# Patient Record
Sex: Female | Born: 1975 | Race: Black or African American | Hispanic: No | Marital: Married
Health system: Southern US, Community
[De-identification: ages and names within clinical notes are randomized; demographics above are authoritative.]

---

## 1999-10-30 ENCOUNTER — Emergency Department (HOSPITAL_COMMUNITY): Admission: EM | Admit: 1999-10-30 | Discharge: 1999-10-30 | Payer: Self-pay | Admitting: *Deleted

## 2000-07-03 ENCOUNTER — Other Ambulatory Visit: Admission: RE | Admit: 2000-07-03 | Discharge: 2000-07-03 | Payer: Self-pay | Admitting: Internal Medicine

## 2000-10-09 ENCOUNTER — Emergency Department (HOSPITAL_COMMUNITY): Admission: EM | Admit: 2000-10-09 | Discharge: 2000-10-09 | Payer: Self-pay | Admitting: Emergency Medicine

## 2001-10-07 ENCOUNTER — Emergency Department (HOSPITAL_COMMUNITY): Admission: EM | Admit: 2001-10-07 | Discharge: 2001-10-07 | Payer: Self-pay | Admitting: Emergency Medicine

## 2002-03-17 ENCOUNTER — Other Ambulatory Visit: Admission: RE | Admit: 2002-03-17 | Discharge: 2002-03-17 | Payer: Self-pay | Admitting: Internal Medicine

## 2003-05-12 ENCOUNTER — Other Ambulatory Visit: Admission: RE | Admit: 2003-05-12 | Discharge: 2003-05-12 | Payer: Self-pay | Admitting: Gynecology

## 2004-02-07 ENCOUNTER — Other Ambulatory Visit: Admission: RE | Admit: 2004-02-07 | Discharge: 2004-02-07 | Payer: Self-pay | Admitting: Gynecology

## 2004-08-21 ENCOUNTER — Encounter (INDEPENDENT_AMBULATORY_CARE_PROVIDER_SITE_OTHER): Payer: Self-pay | Admitting: Specialist

## 2004-08-21 ENCOUNTER — Inpatient Hospital Stay (HOSPITAL_COMMUNITY): Admission: AD | Admit: 2004-08-21 | Discharge: 2004-08-24 | Payer: Self-pay | Admitting: Gynecology

## 2004-10-02 ENCOUNTER — Other Ambulatory Visit: Admission: RE | Admit: 2004-10-02 | Discharge: 2004-10-02 | Payer: Self-pay | Admitting: Gynecology

## 2006-01-16 ENCOUNTER — Other Ambulatory Visit: Admission: RE | Admit: 2006-01-16 | Discharge: 2006-01-16 | Payer: Self-pay | Admitting: Obstetrics and Gynecology

## 2007-01-17 ENCOUNTER — Other Ambulatory Visit: Admission: RE | Admit: 2007-01-17 | Discharge: 2007-01-17 | Payer: Self-pay | Admitting: Obstetrics and Gynecology

## 2008-09-17 ENCOUNTER — Ambulatory Visit: Payer: Self-pay | Admitting: Internal Medicine

## 2008-09-17 DIAGNOSIS — A088 Other specified intestinal infections: Secondary | ICD-10-CM

## 2009-01-07 ENCOUNTER — Ambulatory Visit: Payer: Self-pay | Admitting: Internal Medicine

## 2009-01-07 DIAGNOSIS — J069 Acute upper respiratory infection, unspecified: Secondary | ICD-10-CM | POA: Insufficient documentation

## 2009-02-09 ENCOUNTER — Ambulatory Visit (HOSPITAL_COMMUNITY): Admission: RE | Admit: 2009-02-09 | Discharge: 2009-02-09 | Payer: Self-pay | Admitting: Orthopedic Surgery

## 2009-02-10 ENCOUNTER — Ambulatory Visit (HOSPITAL_COMMUNITY): Admission: RE | Admit: 2009-02-10 | Discharge: 2009-02-10 | Payer: Self-pay | Admitting: Orthopedic Surgery

## 2009-06-26 ENCOUNTER — Emergency Department (HOSPITAL_BASED_OUTPATIENT_CLINIC_OR_DEPARTMENT_OTHER): Admission: EM | Admit: 2009-06-26 | Discharge: 2009-06-26 | Payer: Self-pay | Admitting: Emergency Medicine

## 2009-06-27 ENCOUNTER — Ambulatory Visit: Payer: Self-pay | Admitting: Occupational Medicine

## 2009-07-11 ENCOUNTER — Emergency Department (HOSPITAL_BASED_OUTPATIENT_CLINIC_OR_DEPARTMENT_OTHER): Admission: EM | Admit: 2009-07-11 | Discharge: 2009-07-11 | Payer: Self-pay | Admitting: Emergency Medicine

## 2010-10-23 ENCOUNTER — Ambulatory Visit: Payer: Self-pay | Admitting: Internal Medicine

## 2010-11-26 ENCOUNTER — Encounter: Payer: Self-pay | Admitting: Orthopedic Surgery

## 2010-12-07 NOTE — Assessment & Plan Note (Signed)
Summary: cough/cold/dr plot pt/cd   Vital Signs:  Patient profile:   35 year old female Menstrual status:  regular LMP:     10/13/2010 Height:      64.5 inches Weight:      117.25 pounds BMI:     19.89 O2 Sat:      95 % on Room air Temp:     97.6 degrees F oral Pulse rate:   77 / minute Pulse rhythm:   regular Resp:     16 per minute BP sitting:   108 / 70  (left arm) Cuff size:   regular  Vitals Entered By: Rock Nephew CMA (October 23, 2010 2:33 PM)  O2 Flow:  Room air  Primary Care Marinell Igarashi:  Etta Grandchild MD  CC:  URI symptoms.  History of Present Illness:  URI Symptoms      This is a 35 year old woman who presents with URI symptoms.  The symptoms began 4 days ago.  The severity is described as mild.  The patient reports dry cough, productive cough, and sick contacts, but denies nasal congestion, clear nasal discharge, purulent nasal discharge, sore throat, and earache.  The patient denies fever, stiff neck, dyspnea, wheezing, rash, vomiting, diarrhea, use of an antipyretic, and response to antipyretic.  The patient also reports headache and muscle aches.  The patient denies itchy throat, sneezing, seasonal symptoms, and severe fatigue.  The patient denies the following risk factors for Strep sinusitis: unilateral facial pain, unilateral nasal discharge, poor response to decongestant, double sickening, tooth pain, Strep exposure, tender adenopathy, and absence of cough.    Preventive Screening-Counseling & Management  Alcohol-Tobacco     Alcohol drinks/day: 0     Alcohol Counseling: not indicated; patient does not drink     Smoking Status: never     Passive Smoke Exposure: no     Tobacco Counseling: not indicated; no tobacco use  Hep-HIV-STD-Contraception     Hepatitis Risk: no risk noted     HIV Risk: no     STD Risk: no risk noted      Sexual History:  currently monogamous.        Drug Use:  never.        Blood Transfusions:  no.    Medications Prior to  Update: 1)  Vitamin D3 1000 Unit  Tabs (Cholecalciferol) .Marland Kitchen.. 1 By Mouth Daily 2)  Hydrocodone-Acetaminophen 10-325 Mg Tabs (Hydrocodone-Acetaminophen) 3)  Lidocaine Viscous 2 % Soln (Lidocaine Hcl) .Marland Kitchen.. 10 Ml Swish and Spit Every 3 Hours For Pain  Current Medications (verified): 1)  Vitamin D3 1000 Unit  Tabs (Cholecalciferol) .Marland Kitchen.. 1 By Mouth Daily 2)  Omega 3 3)  Multivitamin 4)  Zithromax Tri-Pak 500 Mg Tab (Azithromycin) .... Take As Directed One By Mouth Once Daily For 3 Days 5)  Hydrocod Polst-Chlorphen Polst 10-8 Mg/78ml Lqcr (Hydrocod Polst-Chlorphen Polst) .... 5 Ml By Mouth Two Times A Day Pen For Cough  Allergies (verified): No Known Drug Allergies  Past History:  Past Medical History: Last updated: 09/17/2008 Unremarkable  Past Surgical History: Last updated: 09/17/2008 Denies surgical history  Family History: Last updated: 09/17/2008 Family History Diabetes 1st degree relative Family History Hypertension Family History of Stroke F 1st degree relative <60  Social History: Last updated: 09/17/2008 Occupation: Medical billing for [employer] Married Never Smoked Alcohol use-no Drug use-no Regular exercise-no  Risk Factors: Alcohol Use: 0 (10/23/2010) Exercise: no (09/17/2008)  Risk Factors: Smoking Status: never (10/23/2010) Passive Smoke Exposure: no (10/23/2010)  Family History: Reviewed history from 09/17/2008 and no changes required. Family History Diabetes 1st degree relative Family History Hypertension Family History of Stroke F 1st degree relative <60  Social History: Reviewed history from 09/17/2008 and no changes required. 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  The patient denies anorexia, fever, weight loss, weight gain, decreased hearing, chest pain, syncope, dyspnea on exertion, peripheral edema, hemoptysis, abdominal pain, suspicious skin lesions, and enlarged lymph nodes.    Physical Exam  General:  alert, well-developed, well-nourished, well-hydrated, appropriate dress, normal appearance, healthy-appearing, and cooperative to examination.   Head:  normocephalic, atraumatic, no abnormalities observed, and no abnormalities palpated.   Eyes:  vision grossly intact, pupils equal, and no injection.   Ears:  R ear normal and L ear normal.   Nose:  External nasal examination shows no deformity or inflammation. Nasal mucosa are pink and moist without lesions or exudates. Mouth:  Oral mucosa and oropharynx without lesions or exudates.  Teeth in good repair. Neck:  supple, full ROM, no masses, no thyromegaly, no thyroid nodules or tenderness, no JVD, normal carotid upstroke, no carotid bruits, no cervical lymphadenopathy, and no neck tenderness.   Lungs:  normal respiratory effort, no intercostal retractions, no accessory muscle use, normal breath sounds, no dullness, no fremitus, no crackles, and no wheezes.   Heart:  normal rate, regular rhythm, no murmur, no gallop, no rub, and no JVD.   Abdomen:  soft, non-tender, normal bowel sounds, no distention, no masses, no guarding, no rigidity, no rebound tenderness, no abdominal hernia, no inguinal hernia, no hepatomegaly, and no splenomegaly.   Msk:  No deformity or scoliosis noted of thoracic or lumbar spine.   Pulses:  R and L carotid,radial,femoral,dorsalis pedis and posterior tibial pulses are full and equal bilaterally Extremities:  No clubbing, cyanosis, edema, or deformity noted with normal full range of motion of all joints.   Neurologic:  No cranial nerve deficits noted. Station and gait are normal. Plantar reflexes are down-going bilaterally. DTRs are symmetrical throughout. Sensory, motor  and coordinative functions appear intact. Skin:  turgor normal, color normal, no rashes, no suspicious lesions, no ecchymoses, no petechiae, no purpura, no ulcerations, and no edema.   Cervical Nodes:  no anterior cervical adenopathy and no posterior cervical adenopathy.   Axillary Nodes:  no R axillary adenopathy and no L axillary adenopathy.   Inguinal Nodes:  no R inguinal adenopathy and no L inguinal adenopathy.   Psych:  Cognition and judgment appear intact. Alert and cooperative with normal attention span and concentration. No apparent delusions, illusions, hallucinations   Impression & Recommendations:  Problem # 1:  BRONCHITIS-ACUTE (ICD-466.0) Assessment New  Her updated medication list for this problem includes:    Zithromax Tri-pak 500 Mg Tab (Azithromycin) .Marland Kitchen... Take as directed one by mouth once daily for 3 days    Hydrocod Polst-chlorphen Polst 10-8 Mg/51ml Lqcr (Hydrocod polst-chlorphen polst) .Marland KitchenMarland KitchenMarland KitchenMarland Kitchen 5 ml by mouth two times a day pen for cough  Take antibiotics and other medications as directed. Encouraged to push clear liquids, get enough rest, and take acetaminophen as needed. To be seen in 5-7 days if no improvement, sooner if worse.  Problem # 2:  COUGH (ICD-786.2) will check for  pna, edema ,mass Orders: T-2 View CXR (71020TC)  Complete Medication List: 1)  Vitamin D3 1000 Unit Tabs (Cholecalciferol) .Marland Kitchen.. 1 by mouth daily 2)  Omega 3  3)  Multivitamin  4)  Zithromax Tri-pak 500 Mg Tab (Azithromycin) .... Take as directed one by mouth once daily for 3 days 5)  Hydrocod Polst-chlorphen Polst 10-8 Mg/13ml Lqcr (Hydrocod polst-chlorphen polst) .... 5 ml by mouth two times a day pen for cough  Patient Instructions: 1)  Please schedule a follow-up appointment in 2 weeks. 2)  Take your antibiotic as prescribed until ALL of it is gone, but stop if you develop a rash or swelling and contact our office as soon as possible. 3)  Acute bronchitis symptoms for less than 10 days  are not helped by antibiotics. take over the counter cough medications. call if no improvment in  5-7 days, sooner if increasing cough, fever, or new symptoms( shortness of breath, chest pain). Prescriptions: HYDROCOD POLST-CHLORPHEN POLST 10-8 MG/5ML LQCR (HYDROCOD POLST-CHLORPHEN POLST) 5 ml by mouth two times a day pen for cough  #4 ounces x 0   Entered and Authorized by:   Etta Grandchild MD   Signed by:   Etta Grandchild MD on 10/23/2010   Method used:   Print then Give to Patient   RxID:   [id number] ZITHROMAX TRI-PAK 500 MG TAB (AZITHROMYCIN) Take as directed one by mouth once daily for 3 days  #3 x 0   Entered and Authorized by:   Etta Grandchild MD   Signed by:   Etta Grandchild MD on 10/23/2010   Method used:   Print then Give to Patient   RxID:   [id number]    Orders Added: 1)  T-2 View CXR [71020TC] 2)  Est. Patient Level IV [99371]

## 2011-02-10 LAB — RAPID STREP SCREEN (MED CTR MEBANE ONLY): Streptococcus, Group A Screen (Direct): POSITIVE — AB

## 2011-03-23 NOTE — Op Note (Signed)
NAME:  Rita Church, Rita Church                 ACCOUNT NO.:  [id number]   MEDICAL RECORD NO.:  [id number]          PATIENT TYPE:  INP   LOCATION:  9170                          FACILITY:  [facility]   PHYSICIAN:  Timothy P. Fontaine, M.D.DATE OF BIRTH:  06/22/1976   DATE OF PROCEDURE:  08/21/2004  DATE OF DISCHARGE:                                 OPERATIVE REPORT   PREOPERATIVE DIAGNOSES:  1.  Pregnancy at term.  2.  Nonreassuring fetal tracing.   POSTOPERATIVE DIAGNOSES:  1.  Pregnancy at term.  2.  Nonreassuring fetal tracing.   PROCEDURE:  Primary low transverse cervical cesarean section.   SURGEON:  Timothy P. Fontaine, M.D.   ASSISTANT:  Scrub technician.   ANESTHESIA:  Epidural.   ESTIMATED BLOOD LOSS:  Less than 500 mL.   COMPLICATIONS:  None.   SPECIMENS:  Samples of cord blood and placenta.   FINDINGS:  At 12:32 normal female, Apgar's of 9 & 9, weight 6 pounds 3 ounces,  pelvic anatomy noted to be normal.   DESCRIPTION OF PROCEDURE:  The patient was taken to the operating room and  had her epidural catheter dosed and was placed in left tilt supine position,  received abdominal preparation with Betadine solution and the patient was  draped in the usual fashion. A Foley catheter previously placed on labor and  delivery. After assuring adequate anesthesia, the abdomen was sharply  entered through a Pfannenstiel incision achieving adequate hemostasis at all  levels. The bladder flap was sharply and bluntly developed without  difficulty, uterus was sharply entered in the lower uterine segment and  bluntly extended laterally. The membranes were ruptured, the fluid noted to  be clear, the infants head delivered through the incision, the nares and  mouth suctioned. The rest of the infant delivered, the cord doubly clamped  and cut and the infant handed to pediatric's in attendance.  Samples of cord  blood were obtained, placenta was then spontaneously extruded and noted to  be intact and  was sent to pathology. The patient received 1 g of Ancef  antibiotic prophylaxis. The uterus was exteriorized, endometrial cavity  explored with a sponge to remove all placental membrane fragments.  The  uterine incision was closed in one layer using #0 Vicryl suture in a running  interlocking stitch. The uterus was returned to the abdomen which was  copiously irrigated, adequate hemostasis visualized. The anterior fascia was  reapproximated using #0 Vicryl suture in a  running stitch. The subcutaneous tissues were irrigated, adequate hemostasis  achieved with electrocautery. The skin reapproximated using 4-0 Vicryl in  running subcuticular stitch. Benzoin and Steri-Strips applied. A sterile  dressing applied, patient taken to the recovery room in good condition  having tolerated the procedure well.      TPF/MEDQ  D:  08/21/2004  T:  08/22/2004  Job:  [id number]

## 2011-03-23 NOTE — Discharge Summary (Signed)
NAME:  Rita Church, Rita Church                 ACCOUNT NO.:  [id number]   MEDICAL RECORD NO.:  [id number]          PATIENT TYPE:  INP   LOCATION:  9118                          FACILITY:  [facility]   PHYSICIAN:  Juan H. Lily Peer, M.D.DATE OF BIRTH:  27-Feb-1976   DATE OF ADMISSION:  08/21/2004  DATE OF DISCHARGE:                                 DISCHARGE SUMMARY   Total days hospitalized:  3.   HISTORY:  The patient is a 35 year old gravida 1 para 0 who was admitted to  [facility] on the morning of August 21, 2004 complaining of  spontaneous rupture of membranes.  She was 4 cm dilated, 90% effaced, -2  station with gross rupture of membranes and clear.  At that point had a  reassuring fetal heart rate tracing.  She began experiencing a nonreassuring  fetal heart rate tracing when she had reached 5-6 cm dilated and 0%  effacement and was taken for a primary cesarean section by Dr. Colin Broach whereby the patient delivered a viable female infant, Apgars of 9 and  9, with a weight of 6 pounds 3 ounces.  The patient had an uneventful  postpartum course.  Her postpartum day #1 her hemoglobin was 10, her blood  type was O positive, and she was rubella immune.  Her Foley catheter was  removed after 24 hours.  Her diet was advanced and she was ready to be  discharged home on postoperative day #3 where she remained afebrile, passing  flatus, and tolerating a regular diet well.  She was discharged home on  Lortab 7.5/500 one p.o. q.4-6h. p.r.n. pain, to continue her prenatal  vitamins one p.o. daily, as well as iron supplementation for her anemia, and  to follow up in the office in 6 weeks at [facility] for a  postpartum visit.     Juan   JHF/MEDQ  D:  08/24/2004  T:  08/24/2004  Job:  [id number]

## 2011-03-23 NOTE — H&P (Signed)
NAME:  Rita Church, Rita Church                 ACCOUNT NO.:  [id number]   MEDICAL RECORD NO.:  [id number]          PATIENT TYPE:  INP   LOCATION:  9170                          FACILITY:  [facility]   PHYSICIAN:  Juan H. Lily Peer, M.D.DATE OF BIRTH:  August 05, 1976   DATE OF ADMISSION:  08/21/2004  DATE OF DISCHARGE:                                HISTORY & PHYSICAL   CHIEF COMPLAINT:  Spontaneous rupture of membranes.   HISTORY:  The patient is a 35 year old gravida 1 para 0 who presented to  [facility] complaining of spontaneous rupture of membranes at  approximately 0330 that was clear, and in early labor.  She is currently  contracting every 2 to 6 minutes apart with a reassuring fetal heart rate  tracing.  Her cervix is 4 cm, 90%, -2 station, and scalp electrode was  placed.  Review of her prenatal record indicates that she had a benign  prenatal course.  She did have a borderline alpha-fetoprotein but a detailed  ultrasound did not demonstrate any gross anomalies and the patient opted not  to go with an amniocentesis.   PAST MEDICAL HISTORY:  The patient denies any allergies, unremarkable.   REVIEW OF SYSTEMS:  See [employer] form.   PHYSICAL EXAMINATION:  VITAL SIGNS:  Stable.  HEENT:  Unremarkable.  NECK:  Supple, trachea midline.  No carotid bruits, no thyromegaly.  LUNGS:  Clear to auscultation without rhonchi or wheezes.  HEART:  Regular rate and rhythm.  No murmurs or gallops.  BREAST:  Exam not done.  ABDOMEN:  Gravid uterus, vertex presentation by [employer].  PELVIC:  Cervix 4+ cm, 90% effaced, -2 station, gross rupture of membranes  is clear.  EXTREMITIES:  Trace edema, DTRs 1+, negative clonus.   PRENATAL LABORATORY DATA:  O positive blood type, negative antibody screen.  VDRL was nonreactive.  Rubella immune.  Hepatitis surface antigen and HIV  were negative.  Alpha-fetoprotein borderline.  Diabetes screen was normal.  Hemoglobin electrophoresis was normal.  GBS status  is being checked on at  the time of this dictation.   ASSESSMENT:  A 35 year old gravida 1 para 0 at 39-and-a-half weeks estimated  gestational age with rupture of membranes at approximately 35 with early  labor.  The patient requested for an epidural.  After the epidural is on  board we will proceed then with Pitocin augmentation.  We will check on the  GBS status and if it indeed is positive,  she will be started on penicillin G for prophylaxis.  Fetal heart rate  tracing is reassuring and anticipate a vaginal delivery.   PLAN:  As per assessment above.      JHF/MEDQ  D:  08/21/2004  T:  08/21/2004  Job:  [id number]

## 2011-04-17 ENCOUNTER — Other Ambulatory Visit: Payer: Self-pay | Admitting: Obstetrics and Gynecology

## 2012-06-22 IMAGING — CR DG CHEST 2V
2 series · 2 of 2 positions shown · non-contrast
Comparison: None.

CLINICAL DATA: Cough.

CHEST - 2 VIEW

[view not recorded (1 of 2)]
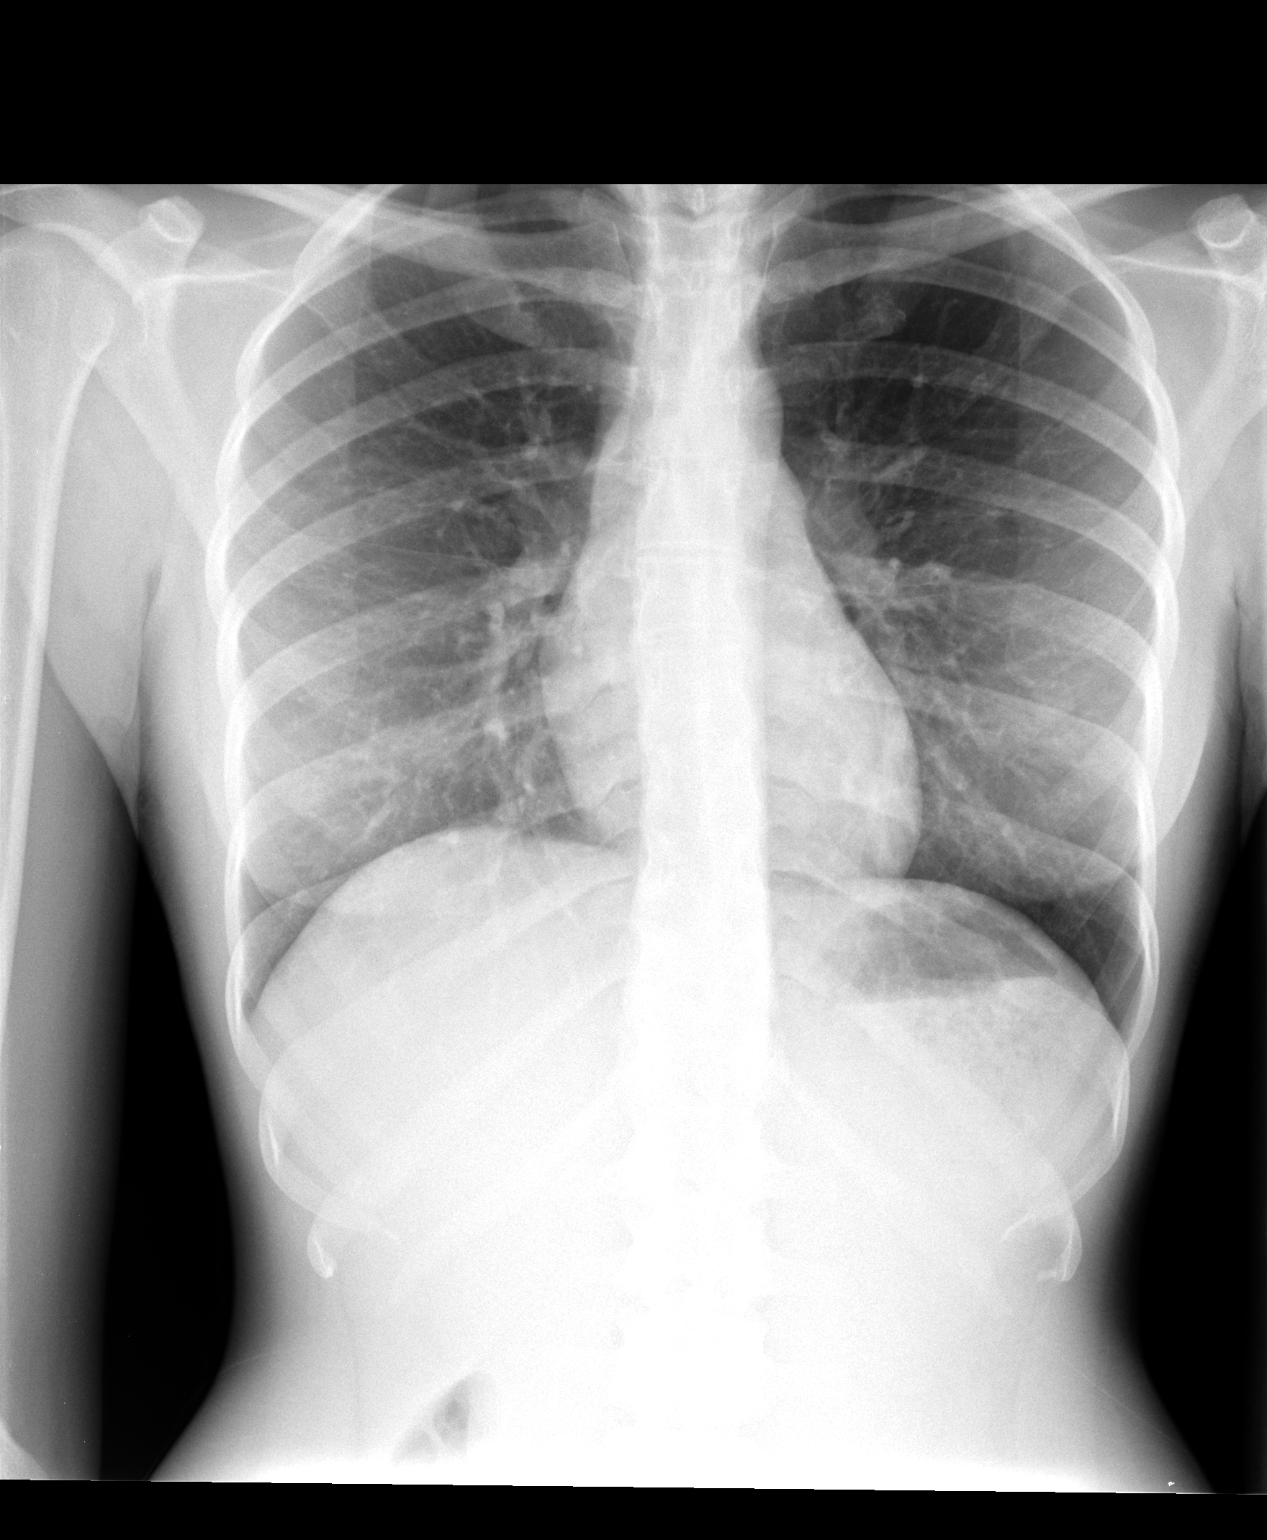

[view not recorded (2 of 2)]
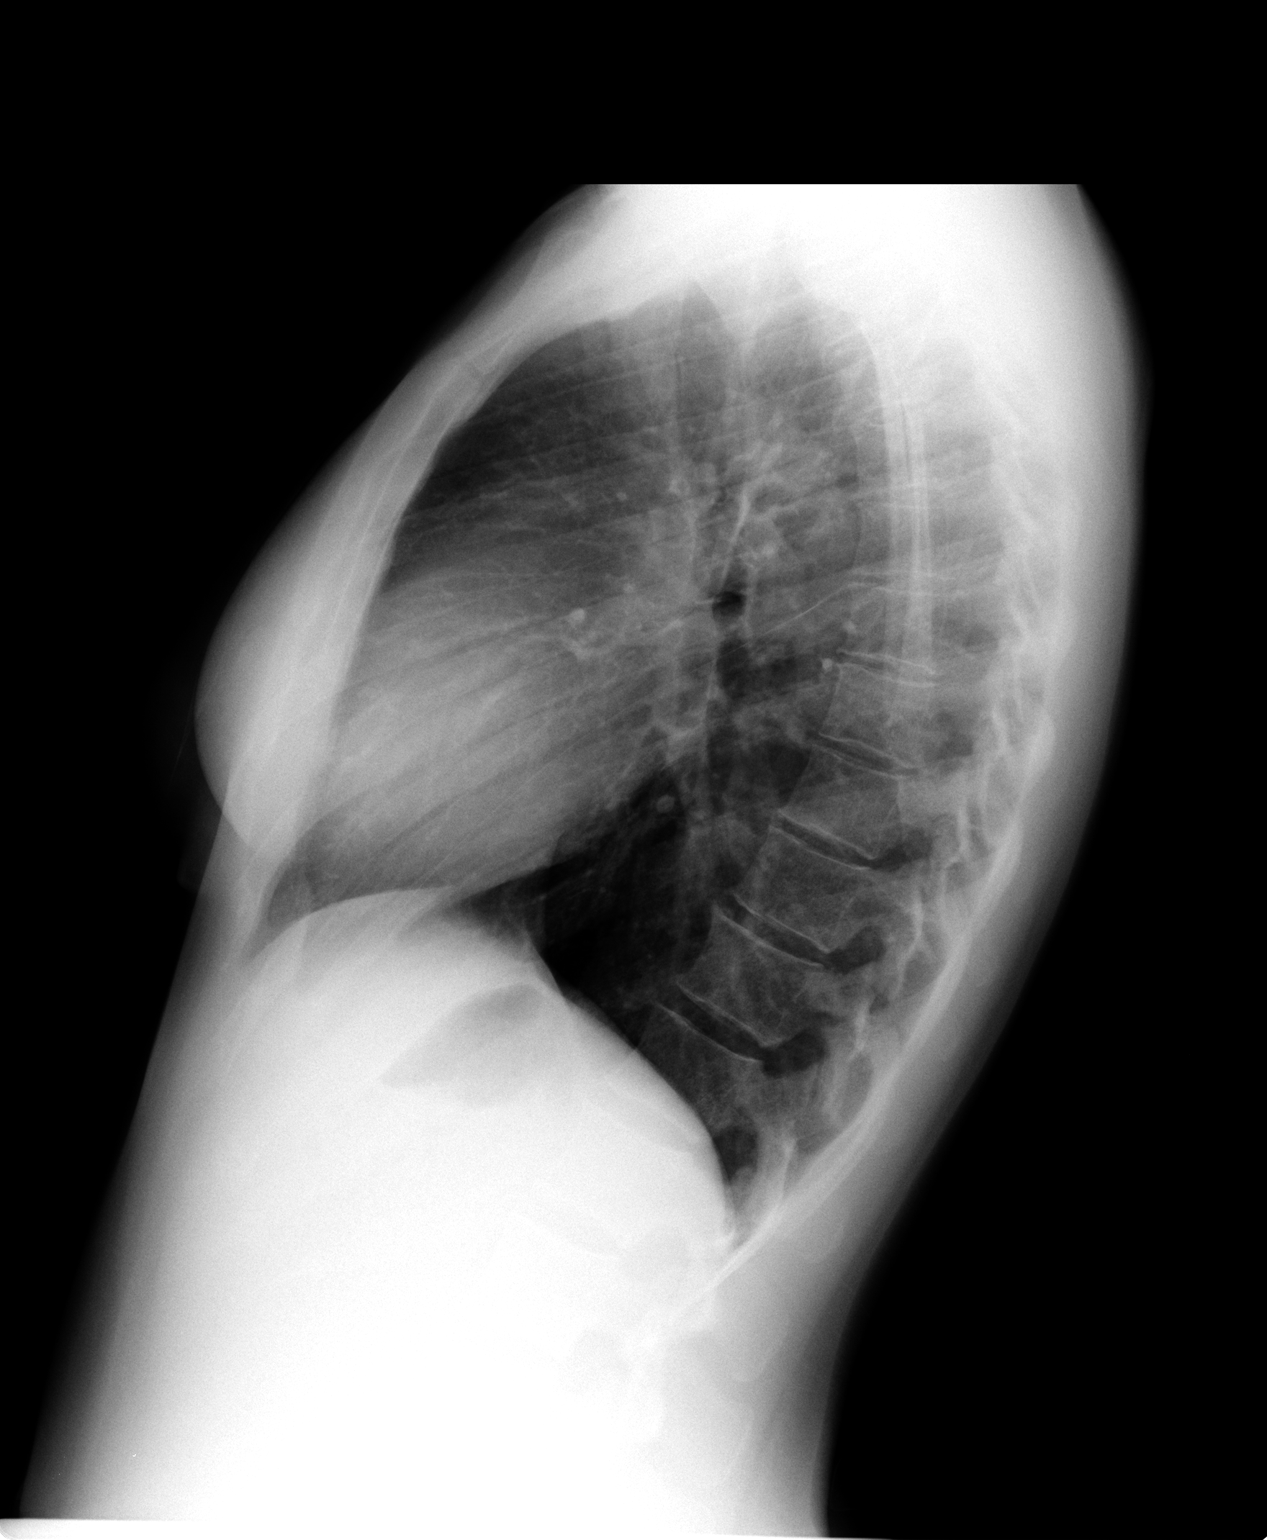

[2 of 2 positions shown; findings below may reference images not displayed]

FINDINGS: The lungs are clear bilaterally.  No confluent airspace
opacities, pleural effuions or pneumothoracies are seen.  The heart
is normal in size and contour.  The upper abdomen and osseous
structures are normal.
IMPRESSION: No acute cardiopulmonary disease.

## 2013-06-02 ENCOUNTER — Other Ambulatory Visit: Payer: Self-pay | Admitting: Obstetrics and Gynecology

## 2014-06-22 ENCOUNTER — Other Ambulatory Visit: Payer: Self-pay | Admitting: Obstetrics and Gynecology

## 2014-06-23 LAB — CYTOLOGY - PAP

## 2014-12-07 ENCOUNTER — Telehealth: Payer: Self-pay | Admitting: Internal Medicine

## 2014-12-07 NOTE — Telephone Encounter (Signed)
Received medical records from [facility]. Sent to Dr. Posey ReaPlotnikov. 12/07/14/ss.

## 2015-06-28 ENCOUNTER — Other Ambulatory Visit: Payer: Self-pay | Admitting: Obstetrics and Gynecology

## 2015-06-28 LAB — TSH: TSH: 1.26 u[IU]/mL (ref 0.41–5.90)

## 2015-06-28 LAB — HM PAP SMEAR: HM Pap smear: NEGATIVE

## 2015-06-29 LAB — CYTOLOGY - PAP

## 2015-06-30 ENCOUNTER — Encounter: Payer: Self-pay | Admitting: Internal Medicine

## 2015-06-30 LAB — T4, FREE: T4,Free (Direct): 1.01

## 2016-06-28 ENCOUNTER — Other Ambulatory Visit: Payer: Self-pay | Admitting: Obstetrics and Gynecology

## 2016-06-29 LAB — CYTOLOGY - PAP

## 2022-12-18 ENCOUNTER — Ambulatory Visit: Payer: Self-pay | Admitting: Internal Medicine
